# Patient Record
Sex: Female | Born: 1975 | Race: Black or African American | Hispanic: No | Marital: Single
Health system: Southern US, Academic
[De-identification: ages and names within clinical notes are randomized; demographics above are authoritative.]

## PROBLEM LIST (undated history)

## (undated) ENCOUNTER — Encounter

## (undated) ENCOUNTER — Encounter: Attending: Neurology | Primary: Neurology

## (undated) ENCOUNTER — Ambulatory Visit: Payer: PRIVATE HEALTH INSURANCE | Attending: Neurology | Primary: Neurology

## (undated) ENCOUNTER — Ambulatory Visit: Attending: Physical Medicine & Rehabilitation | Primary: Physical Medicine & Rehabilitation

## (undated) ENCOUNTER — Telehealth

## (undated) ENCOUNTER — Encounter
Attending: Pharmacist Clinician (PhC)/ Clinical Pharmacy Specialist | Primary: Pharmacist Clinician (PhC)/ Clinical Pharmacy Specialist

## (undated) ENCOUNTER — Telehealth: Attending: Neurology | Primary: Neurology

## (undated) ENCOUNTER — Ambulatory Visit

## (undated) ENCOUNTER — Ambulatory Visit: Payer: PRIVATE HEALTH INSURANCE

## (undated) ENCOUNTER — Ambulatory Visit: Payer: PRIVATE HEALTH INSURANCE | Attending: Physician Assistant | Primary: Physician Assistant

## (undated) ENCOUNTER — Encounter: Attending: Physician Assistant | Primary: Physician Assistant

## (undated) ENCOUNTER — Ambulatory Visit: Payer: PRIVATE HEALTH INSURANCE | Attending: Urology | Primary: Urology

## (undated) ENCOUNTER — Encounter: Attending: Family | Primary: Family

## (undated) ENCOUNTER — Ambulatory Visit
Attending: Pharmacist Clinician (PhC)/ Clinical Pharmacy Specialist | Primary: Pharmacist Clinician (PhC)/ Clinical Pharmacy Specialist

## (undated) DIAGNOSIS — G35 Multiple sclerosis: Secondary | ICD-10-CM

## (undated) HISTORY — PX: ABDOMINAL HYSTERECTOMY: SHX81

---

## 2017-10-24 ENCOUNTER — Ambulatory Visit
Admit: 2017-10-24 | Discharge: 2017-10-24 | Disposition: A | Discharge status: Home / Self Care | Payer: PRIVATE HEALTH INSURANCE | Attending: Dermatology

## 2017-10-24 MED ORDER — CLINDAMYCIN 2 % VAGINAL CREAM
Freq: Every evening | VAGINAL | 0 refills | 0.00000 days | Status: CP
Start: 2017-10-24 — End: 2017-10-31

## 2017-10-24 MED ORDER — FLUCONAZOLE 150 MG TABLET
ORAL_TABLET | 0 refills | 0 days | Status: CP
Start: 2017-10-24 — End: 2018-01-31

## 2018-01-31 ENCOUNTER — Ambulatory Visit
Admit: 2018-01-31 | Discharge: 2018-01-31 | Disposition: A | Discharge status: Home / Self Care | Payer: PRIVATE HEALTH INSURANCE

## 2018-01-31 MED ORDER — TIZANIDINE 2 MG TABLET
ORAL_TABLET | Freq: Four times a day (QID) | ORAL | 0 refills | 0 days | Status: CP | PRN
Start: 2018-01-31 — End: 2018-05-03

## 2018-02-14 ENCOUNTER — Ambulatory Visit: Admit: 2018-02-14 | Discharge: 2018-02-15 | Payer: PRIVATE HEALTH INSURANCE

## 2018-02-14 DIAGNOSIS — G35 Multiple sclerosis: Principal | ICD-10-CM

## 2018-02-21 ENCOUNTER — Ambulatory Visit
Admit: 2018-02-21 | Discharge: 2018-02-22 | Disposition: A | Discharge status: Home / Self Care | Payer: PRIVATE HEALTH INSURANCE

## 2018-02-21 MED ORDER — MELOXICAM 15 MG TABLET
ORAL_TABLET | Freq: Every day | ORAL | 0 refills | 0.00000 days | Status: CP
Start: 2018-02-21 — End: 2018-03-15

## 2018-02-22 MED ORDER — CLINDAMYCIN 2 % VAGINAL CREAM
Freq: Every evening | VAGINAL | 0 refills | 0.00000 days | Status: CP
Start: 2018-02-22 — End: 2018-03-01

## 2018-03-08 ENCOUNTER — Ambulatory Visit: Admit: 2018-03-08 | Discharge: 2018-03-09 | Payer: PRIVATE HEALTH INSURANCE | Attending: Family | Primary: Family

## 2018-03-08 DIAGNOSIS — M62838 Other muscle spasm: Principal | ICD-10-CM

## 2018-03-08 MED ORDER — DICLOFENAC SODIUM 50 MG TABLET,DELAYED RELEASE
ORAL_TABLET | Freq: Two times a day (BID) | ORAL | 0 refills | 0 days | Status: CP
Start: 2018-03-08 — End: ?

## 2018-03-15 ENCOUNTER — Ambulatory Visit: Admit: 2018-03-15 | Discharge: 2018-03-16 | Payer: PRIVATE HEALTH INSURANCE | Attending: Neurology | Primary: Neurology

## 2018-03-15 DIAGNOSIS — Z23 Encounter for immunization: Secondary | ICD-10-CM

## 2018-03-15 DIAGNOSIS — F419 Anxiety disorder, unspecified: Secondary | ICD-10-CM

## 2018-03-15 DIAGNOSIS — G379 Demyelinating disease of central nervous system, unspecified: Principal | ICD-10-CM

## 2018-03-15 DIAGNOSIS — G959 Disease of spinal cord, unspecified: Secondary | ICD-10-CM

## 2018-03-15 MED ORDER — DIAZEPAM 5 MG TABLET
ORAL_TABLET | Freq: Once | ORAL | 0 refills | 0 days | Status: CP | PRN
Start: 2018-03-15 — End: 2018-05-03

## 2018-04-08 ENCOUNTER — Ambulatory Visit: Admit: 2018-04-08 | Discharge: 2018-04-09 | Payer: PRIVATE HEALTH INSURANCE

## 2018-04-08 DIAGNOSIS — G379 Demyelinating disease of central nervous system, unspecified: Principal | ICD-10-CM

## 2018-05-03 ENCOUNTER — Ambulatory Visit: Admit: 2018-05-03 | Discharge: 2018-05-04 | Payer: PRIVATE HEALTH INSURANCE | Attending: Neurology | Primary: Neurology

## 2018-05-03 DIAGNOSIS — E559 Vitamin D deficiency, unspecified: Secondary | ICD-10-CM

## 2018-05-03 DIAGNOSIS — G379 Demyelinating disease of central nervous system, unspecified: Principal | ICD-10-CM

## 2018-05-03 MED ORDER — CHOLECALCIFEROL (VITAMIN D3) 100 MCG (4,000 UNIT) TABLET
ORAL_TABLET | Freq: Every day | ORAL | 11 refills | 0.00000 days | Status: CP
Start: 2018-05-03 — End: ?

## 2018-05-04 MED ORDER — GLATIRAMER 40 MG/ML SUBCUTANEOUS SYRINGE
SUBCUTANEOUS | 5 refills | 0 days | Status: CP
Start: 2018-05-04 — End: 2018-07-06
  Filled 2018-05-16: qty 12, 28d supply, fill #0

## 2018-05-04 NOTE — Unmapped (Signed)
Per test claim for GLATIRAMER 40 MG/ML SYRINGE at the [facility], patient needs Medication Assistance Program for Prior Authorization.

## 2018-05-04 NOTE — Unmapped (Signed)
[facility] [place] at [facility]  Multiple Sclerosis/Neuroimmunology Division  Sarita Bottom, MD  Associate Professor of Neurology    DATE OF VISIT: 05/03/2018    Re:  Debbie Chen  [address]  [place]  [place] [address]  MRN: [id number]  DOB: 1976/01/10        Direct entry by: Dr. Sarita Bottom    Visit: Follow-up Visit      REASON FOR VISIT: Debbie Chen, a 42 y.o. African American right handed female, is seen in consultation at the [facility], Multiple Sclerosis/Neuroimmunology Division at the request of Lucienne Capers, MD for the evaluation of MS.   Ms. Seanna Sisler was last time seen at the [facility], Multiple Sclerosis/Neuroimmunology Division on 03/15/18.    Assessment:     1. I took a detailed interval history of the present illness fromMs. Georgia Chen , details on past medical history, family history and social history.  2. I personally reviewed  patient's interval medical records, MRI images and discussed them with the patient. .   3. The differential diagnosis and the plan for the diagnostic work-up were discussed in details with the patient. Debbie Chen agreed with the recommended diagnostic plan  4. Potential treatment options have been  discussed with Debbie Chen, who agreed with the recommended treatment plan.                                                                                                                                                  ?? Multiple sclerosis (previous diagnosis):  Debbie Chen, is a 42 y.o. African American right handed female with a previous diagnosis of MS. Her brain MRI still does not meet criteria for DIS in MS. She does have a T spinal cord lesion that may be a sequel of her transverse myelitis. We will perform the spinal tap to check for the dynamics of the CSF OCBs (that were per previous notes- positive in the past). Given possible MS, I would recommend to re-start Glatiramer acetate, given its favorable safety profile. Please see the detailed plan below.     Plan:     1) Re-start Glatiramer acetate, 40mg  three times a week s.c.  2) schedule LP for routine CSF  3) Start Vitamin D 4000 U/day for vitamin D insufficiency  3) Follow-up with me in 6 months, earlier if needed       Subjective:     HISTORY OF PRESENT ILLNESS:  Debbie Chen, is a 42 y.o. African American right handed female with a previous diagnosis of MS.   Per records Her first MS exacerbation was back on 2006 when she had numbness in the left arm and leg lasting for  2 weeks from breat down and diagnosed with TM in [place]. She received IV steroids at that time. She was seen by a neurologist in [place], Dr. Estrellita Ludwig. Lumbar puncture was positive for oligoclonal bands. She was diagnosed with hemi transverse myelitis, incomplete. MRI of the cervical spine showed a mildly expansile C5 cord lesion with underlying enhancement.  She had a second clinical event was after 5 years, with similar symptoms on the left side of the body not affecting face. She received IV steroids and recovered.  Per notes, her next  attack was in  05/22/2016. She remembers she had left sided numbness including the face. She did not receive IV steroids at that time, but got the steroid oral pack. Symptoms recovered.  After that last attack she did not have any new flares.     Current symptoms: stiff neck, muscle tightness, leg numbness, leg pain, fatigue, bladder control problems (leakage, incomplete emptying, hesitancy). She has seen urologist one time long time ago. Sometimes vision gets blurry 1-2 h on the left eye. She has frontal headaches, not frequent, sometimes wakes up with a headache, takes Aleve PRN (has headaches 3-4x week).  Takes Diclofenac for neck pain, Takes VitamiN D 5000 U/day.    MS DMD history:    Copaxone was started in 2011 and stopped in January 2018. Since then she is off medications ('maybe because she was not seeing the doctor).     Had hysterectomy due to fibroids and endometriosis.     Never used birth control. She is not sure about having or not miscarriages. Has hip pain, dry eyes. Denies  mouth sores, no genital sores. Since the last visit with me she did not have new symptoms.     Please see below the results of the diagnostic work-up performed so far.   ............................................................................................................................................Debbie Chen  DIAGNOSTIC STUDIES / REVIEW OF RECORDS:    MRI:  06/20/2016: Brain MRI with and w/o contrast, report (COMPARISON: 04/06/2015): The ventricles and sulci are symmetric in appearance and within normal limits for age.  Images are mildly degraded by motion and axial FLAIR sequences were repeated.I am not convinced of any definite focal lesions. Questionable small areas described previously, including a tiny focus adjacent to the right frontal horn and minimal linear signal along the undersurface of the posterior callosum may be physiologic. No definite stigmata of demyelinating disease. No new findings. No extra-axial fluid collection. No acute infarction. No pathologic enhancement. The pituitary, pineal region, and foramen magnum are unremarkable, and flow voids are present in the major intracranial vessels and dural sinuses.The orbits and paranasal sinuses are unremarkable.  06/20/2016: Cervical MRI with and w/o contrast, report (COMPARISON: April 06, 2015): Focal cord lesion with enhancement at the C6 level could represent demyelinating disease and has developed since the previous study. Further follow-up may be helpful for confirmation if clinically appropriate.  06/20/2016: Thoracic MRI with and w/o contrast, report (COMPARISON: April 06, 2015): Unremarkable thoracic spine MRI.  04/08/2018: Brain MRI with and w/o contrast, report (COMPARISON: MRI brain 06/20/2016): Multiple small supratentorial foci of white matter T2/FLAIR hyperintensity within appearance and distribution which may be compatible with multiple sclerosis. Several lesions appear new from prior exam 06/20/2016. No enhancing lesions. The optic nerves are unremarkable.  04/08/2018: Cervical MRI with and w/o contrast, report ( COMPARISON: MRI brain 06/20/2016): - Short segment T2 hyperintense spinal cord lesion at the T6 level. No abnormal cord enhancement.   - Overall mild degenerative change of the cervical spine with moderate-to-severe  right C3-C4 neural foraminal narrowing.  04/08/2018: Thoracic MRI with and w/o contrast, report: Small T2 hyperintense lesion within the left paramedian spinal cord at the T6-T7 level. No abnormal cord enhancement.     Lumbar puncture:  Positive CSF OCBs per notes, but results will be requested.     Blood tests:   11/18/2015: RPR and HIV negative.    Office Visit on 03/15/2018   Component Date Value Ref Range Status   ??? Sodium 03/15/2018 138  135 - 145 mmol/L Final   ??? Potassium 03/15/2018 4.2  3.5 - 5.0 mmol/L Final   ??? Chloride 03/15/2018 100  98 - 107 mmol/L Final   ??? CO2 03/15/2018 27.0  22.0 - 30.0 mmol/L Final   ??? BUN 03/15/2018 12  7 - 21 mg/dL Final   ??? Creatinine 03/15/2018 0.80  0.60 - 1.00 mg/dL Final   ??? BUN/Creatinine Ratio 03/15/2018 15   Final   ??? EGFR CKD-EPI Non-African American,* 03/15/2018 >90  >=60 mL/min/1.89m2 Final   ??? EGFR CKD-EPI African American, Fem* 03/15/2018 >90  >=60 mL/min/1.18m2 Final   ??? Glucose 03/15/2018 87  65 - 179 mg/dL Final   ??? Calcium 16/03/9603 9.7  8.5 - 10.2 mg/dL Final   ??? Albumin 54/02/8118 4.1  3.5 - 5.0 g/dL Final   ??? Total Protein 03/15/2018 8.1  6.5 - 8.3 g/dL Final   ??? Total Bilirubin 03/15/2018 0.3  0.0 - 1.2 mg/dL Final   ??? AST 14/78/2956 21  14 - 38 U/L Final   ??? ALT 03/15/2018 <8* 15 - 48 U/L Final   ??? Alkaline Phosphatase 03/15/2018 68  38 - 126 U/L Final   ??? Anion Gap 03/15/2018 11  9 - 15 mmol/L Final   ??? Antinuclear Antibodies (ANA) 03/15/2018 Positive* Negative Final   ??? ANA Pattern 1 03/15/2018 Speckled   Final   ??? ANA Titer 1 03/15/2018 1:80   Final   ??? ENA Screen 03/15/2018 0.20  <0.70 ENA Units Final   ??? ANCA IFA 03/15/2018 Positive, Perinuclear Pattern* Negative Final   ??? MPO-Elisa 03/15/2018 Negative  Negative Final   ??? MPO-Quant 03/15/2018 1.5  <21.0 U/mL Final   ??? PR3 Elisa 03/15/2018 Negative  Negative Final   ??? PR3-Quant 03/15/2018 12.0  <21.0 U/mL Final   ??? Rheumatoid Factor 03/15/2018 <8.6  0.0 - 15.0 IU/mL Final   ??? Angio Convert Enzyme 03/15/2018 59  16 - 85 U/L Final   ??? Lyme Ab (Serology) 03/15/2018 Negative  Negative Final   ??? NMO AQP4 IgG, Serum 03/15/2018 Negative  Negative Final   ??? MOG FACS 03/15/2018 Negative  Negative Final   ??? Varicella IgG 03/15/2018 Positive   Final   ??? HSV 1 IgG 03/15/2018 Positive* Negative Final   ??? HSV 2 IgG 03/15/2018 Positive* Negative Final   ??? HSV Igm Screen 03/15/2018 Negative  Negative Final   ??? Vitamin D Total (25OH) 03/15/2018 27.6  20.0 - 80.0 ng/mL Final   ??? Vitamin B-12 03/15/2018 914* 193 - 900 pg/ml Final   ??? Vit D, 1,25-Dihydroxy 03/15/2018 38  18 - 78 pg/mL Final   ??? WBC 03/15/2018 6.2  4.5 - 11.0 10*9/L Final   ??? RBC 03/15/2018 4.93  4.00 - 5.20 10*12/L Final   ??? HGB 03/15/2018 13.4  12.0 - 16.0 g/dL Final   ??? HCT 21/30/8657 43.8  36.0 - 46.0 % Final   ??? MCV 03/15/2018 89.0  80.0 - 100.0 fL Final   ??? MCH 03/15/2018 27.1  26.0 - 34.0 pg Final   ??? MCHC 03/15/2018 30.5* 31.0 - 37.0 g/dL Final   ??? RDW 16/03/9603 13.6  12.0 - 15.0 % Final   ??? MPV 03/15/2018 10.9* 7.0 - 10.0 fL Final   ??? Platelet 03/15/2018 316  150 - 440 10*9/L Final   ??? Neutrophils % 03/15/2018 61.8  % Final   ??? Lymphocytes % 03/15/2018 28.4  % Final   ??? Monocytes % 03/15/2018 5.6  % Final   ??? Eosinophils % 03/15/2018 1.1  % Final   ??? Basophils % 03/15/2018 0.6  % Final   ??? Absolute Neutrophils 03/15/2018 3.9  2.0 - 7.5 10*9/L Final ??? Absolute Lymphocytes 03/15/2018 1.8  1.5 - 5.0 10*9/L Final   ??? Absolute Monocytes 03/15/2018 0.4  0.2 - 0.8 10*9/L Final   ??? Absolute Eosinophils 03/15/2018 0.1  0.0 - 0.4 10*9/L Final   ??? Absolute Basophils 03/15/2018 0.0  0.0 - 0.1 10*9/L Final   ??? Large Unstained Cells 03/15/2018 3  0 - 4 % Final   ??? Hypochromasia 03/15/2018 Moderate* Not Present Final   ??? Dilute Viper Venom Time 03/15/2018 31.9  0.0 - 47.5 s Final   ??? APTT Lupus Anticoagulant 03/15/2018 36.6  0.0 - 48.2 s Final   ??? Anti-PL Interp 03/15/2018    Final   ??? Beta-2 Glyco 1 IgG 03/15/2018 5  <20 Final   ??? Beta-2 Glyco 1 IgM 03/15/2018 7  <20 Final   ??? Anticardiolipin IgG 03/15/2018 11  0 - 23 [GPL'U] Final   ??? Anticardiolipin IgM 03/15/2018 4  0 - 11 [MPL'U] Final       .............................................................................................................................................    Past Medical History:  Past Medical History:   Diagnosis Date   ??? Multiple sclerosis (CMS-HCC)        ALLERGIES:  No Known Allergies    CURRENT MEDICATIONS:    Current Outpatient Medications   Medication Sig Dispense Refill   ??? diazePAM (VALIUM) 5 MG tablet Take 1 tablet (5 mg total) by mouth once as needed (Take 1 pill 30 min prior to MRI, may take one more pill if needed.). for up to 1 dose 2 tablet 0   ??? diclofenac (VOLTAREN) 50 MG EC tablet Take 1 tablet (50 mg total) by mouth Two (2) times a day. 30 tablet 0   ??? tiZANidine (ZANAFLEX) 2 MG tablet Take 1 tablet (2 mg total) by mouth every six (6) hours as needed. For muscle spasm 20 tablet 0     No current facility-administered medications for this visit.            Past Surgical History:    Past Surgical History:   Procedure Laterality Date   ??? HYSTERECTOMY         Social History:    Social History     Socioeconomic History   ??? Marital status: Single     Spouse name: None   ??? Number of children: None   ??? Years of education: None   ??? Highest education level: None   Occupational History ??? None   Social Needs   ??? Financial resource strain: None   ??? Food insecurity:     Worry: None     Inability: None   ??? Transportation needs:     Medical: None     Non-medical: None   Tobacco Use   ??? Smoking status: Never Smoker   ??? Smokeless tobacco: Never Used   Substance and Sexual Activity   ???  Alcohol use: Yes     Comment: 2 glasses of wine per week.    ??? Drug use: No   ??? Sexual activity: Yes     Comment: Not using birth control   Lifestyle   ??? Physical activity:     Days per week: None     Minutes per session: None   ??? Stress: None   Relationships   ??? Social connections:     Talks on phone: None     Gets together: None     Attends religious service: None     Active member of club or organization: None     Attends meetings of clubs or organizations: None     Relationship status: None   Other Topics Concern   ??? None   Social History Narrative   ??? None       Family History:    Family History   Problem Relation Age of Onset   ??? Hypertension Mother    ??? Hyperlipidemia Father    ??? Hypertension Father         Review of Systems:  A 10-systems review was performed and, unless otherwise noted, declared negative by patient.    Objective:     Physical Exam:  Blood pressure 133/70, pulse 84, resp. rate 19, height 170.2 cm (5' 7), weight (!) 105.8 kg (233 lb 3.2 oz), last menstrual period 09/06/2013, not currently breastfeeding.   General Appearance: in no acute distress. Normal skin color, afebrile.  Heart and lungs: Regular heart rate. Eupneic, normal respiratory rate.. Abdomen: Soft, non-tender.No peripheral  edema, peripheral pulses palpable.       NEUROLOGICAL EXAMINATION:     General:  Alert and oriented to person, place, time and situation.    Recent and remote memory intact.    Attention span and concentration normal.    Language and spontaneous speech normal, no dysarthria or aphasia.  Naming/fluency/repetition intact.  Fund of knowledge normal.  Following lateralizing commands across midline.       Cranial Nerves:     II, III- Pupils are equal and reactive to light b/l (direct and consensual reactions). Visual Acuity: 20/20 b/l.  Glasses:yes. No visual field defect. Fundoscopy: clear optic disc margins, no optic disc pallor.   III, IV, VI- extra ocular movements are intact, No ptosis, no diplopia, no nystagmus.  V- sensation of the face intact b/l.   VII- face symmetrical, no facial droop, normal facial movements with smile/grimace  VIII- Hearing grossly intact. Weber test: sound is symmetrical with no lateralization.  IX and X- symmetric palate contraction,  no dysarthria, no dysphagia.  XI- Full shoulder shrug bilaterally; no wasting, normal tone and strength of sternocleidomastoid muscles bilaterally.  XII- No tongue atrophy, no tongue fasciculations; tongue protrudes midline, full range of movements of the tongue.    Neck flexion normal.    Motor Exam:     Normal bulk. Fasciculations not observed.     Muscle strength:    Muscles UEs  LEs    R L  R L   Deltoids 5/5 5/5 Hip flexors  5/5 5/5   Biceps 5/5 5/5 Hip extensors 5/5 5/5   Triceps 5/5 5/5 Knee flexors 5/5 5/5   Hand grip 5/5 5/5 Knee extensors 5/5 5/5   Wrist flexors 5/5 5/5 Foot dorsal flexors 5/5 5/5   Wrist extensors 5/5 5/5 Foot plantar flexors 5/5 5/5   Finger flexors 5/5 5/5 Toes dorsal flexors 5/5 5/5   Finger extensors 5/5 5/5  Toes plantar flexors 5/5 5/5      McArdle's sign negative.       Reflexes R L   Biceps +2 +2   Brachioradialis  +2 +2   Triceps +2 +2   Patella +3 +3   Achilles +2 +2     Normal tone b/l. Negative Babinski sign bilaterally.    Sensory system:  ? Superficial light touch sensation:wnl  ? Vibration sense:wnl  ? Position sense:wnl  ? Pinprick test for pain sensation:wnl  (WNL= within normal limits; UE= upper extremities; LE= lower extremities;       R= right, L= left).    Cerebellar/Coordination:  Rapid alternating movements, finger-to-nose and heel-to-shin  bilaterally demonstrate no abnormalities. No limb ataxia bilaterally. No gait ataxia. Romberg negative. Performs a tandem gait.    Gait: Normal stride, base and  armswing. Able to walk on toes, heels without difficulty.     Tests for meningeal irritation: negative.    .........................................................................................................................................Debbie Chen    VISIT SUMMARY:  Ms. Cariana Karge, a 42 y.o. female  presented for the evaluation of MS.   Ms. Freada Twersky voiced a complete understanding of the diagnostic and treatment plan as detailed above. All questions were answered.   Start of Visit Time: 16:50h  End of Visit Time: 17:24h  Total visit time = 34 minutes    Greater than 50% of the face to face time was spent in consultation on the  disease process, and treatment planning, medication, dosing and side effects.     Thank you for the opportunity to contribute to the care of Debbie Chen.

## 2018-05-05 NOTE — Unmapped (Signed)
[facility] Medication Referral: PA APPROVED    Medication (Brand/Generic): GLATIRAMER    Initial Test Claim completed with resulted information below:  No PA required  Patient ABLE to fill at [facility]  Insurance Company:  [employer]  Anticipated Copay: $11.11  Is anticipated copay with a copay card or grant? NO    Does patient's insurance plan only allow a 15 day supply for the first 6 fills in the [facility]? NO  If yes, inform patient they can request to dis-enroll from the [facility] by calling the patient help desk at N/A.      As Co-pay is under $25 defined limit, per policy there will be no further investigation of need for financial assistance at this time unless patient requests. This referral has been communicated to the provider and handed off to the [facility] team for further processing and filling of prescribed medication.   ______________________________________________________________________  Please utilize this referral for viewing purposes as it will serve as the central location for all relevant documentation and updates.

## 2018-05-11 MED ORDER — EMPTY CONTAINER
2 refills | 0 days
Start: 2018-05-11 — End: ?

## 2018-05-11 NOTE — Unmapped (Signed)
[facility] Neurology  Patient Onboarding/Medication Counseling    Debbie Chen is a 42 y.o. female with MS who I am counseling today on initiation of therapy.    Medication: glatiramer acetate 40 mg    Verified patient's date of birth / HIPAA.  Medications reviewed and verified with patient: Allergies - Medications -    .      Education Provided: ?    Dose/Administration discussed: Inject subcutaneously three times per week, Monday, Wednesday, and Friday. This medication should be taken  without regard to food. Stressed the importance of taking medication as prescribed and to contact provider if that changes at any time. If dose is missed or unsure if it has been missed, instructed patient to call clinic pharmacist. Patient has been on medication in the past and states she is familiar with it and with administration.     Storage requirements: this medicine should be stored in the refrigerator.     Side effects discussed: Discussed common side effects, including injection site reaction, headache, palpitations. If patient experiences this and is bothersome, they need to call the doctor.  Patient will receive a Lexi-Comp drug information handout with shipment.    Handling precautions reviewed:  Patient will dispose of needles in a sharps container or empty laundry detergent bottle.    Drug Interactions: other medications reviewed and up to date in Epic.  No drug interactions identified.    Comorbidities/Allergies: reviewed and up to date in Epic.    Verified therapy is appropriate and should continue      Delivery Information    Medication Assistance provided: Prior Authorization    Anticipated copay of $11.11 reviewed with patient. Verified delivery address in [facility] and reviewed medication storage requirement.    Scheduled delivery date: 05/17/2018    Explained that we ship using UPS and this shipment will not require a signature.      Explained the services we provide at [facility] and that each month we would call to set up refills.  Stressed importance of returning phone calls so that we could ensure they receive their medications in time each month.  Informed patient that we should be setting up refills 7-10 days prior to when they will run out of medication.  Informed patient that welcome packet will be sent.      Patient verbalized understanding of the above information as well as how to contact the pharmacy at [phone] option 4 with any questions/concerns. The pharmacy is open Monday through Friday 8:30am-4:30pm. A pharmacist is available 24/7 via pager to answer any clinical questions they may have.        Patient Specific Needs      ? Patient has no physical, cognitive, or cultural barriers.    ? Patient prefers to have medications discussed with  Patient     ? Patient is able to read and understand education materials at a high school level or above.    ? Patients primary language is  English       Worthy Flank, PharmD, CPP  Clinical Pharmacist, [facility]  Phone: [phone]

## 2018-05-16 MED FILL — EMPTY CONTAINER: 30 days supply | Qty: 1 | Fill #0 | Status: AC

## 2018-05-16 MED FILL — GLATIRAMER 40 MG/ML SUBCUTANEOUS SYRINGE: 28 days supply | Qty: 12 | Fill #0 | Status: AC

## 2018-05-16 MED FILL — EMPTY CONTAINER: 30 days supply | Qty: 1 | Fill #0

## 2018-05-21 ENCOUNTER — Ambulatory Visit: Admit: 2018-05-21 | Discharge: 2018-05-22 | Payer: PRIVATE HEALTH INSURANCE

## 2018-05-21 DIAGNOSIS — B9689 Other specified bacterial agents as the cause of diseases classified elsewhere: Secondary | ICD-10-CM

## 2018-05-21 DIAGNOSIS — M25552 Pain in left hip: Secondary | ICD-10-CM

## 2018-05-21 DIAGNOSIS — G35 Multiple sclerosis: Secondary | ICD-10-CM

## 2018-05-21 DIAGNOSIS — N898 Other specified noninflammatory disorders of vagina: Principal | ICD-10-CM

## 2018-05-21 DIAGNOSIS — N760 Acute vaginitis: Secondary | ICD-10-CM

## 2018-05-21 MED ORDER — NAPROXEN 500 MG TABLET
ORAL_TABLET | Freq: Two times a day (BID) | ORAL | 0 refills | 0.00000 days | Status: CP
Start: 2018-05-21 — End: 2019-05-21

## 2018-05-21 MED ORDER — TIZANIDINE 4 MG TABLET
ORAL_TABLET | Freq: Every evening | ORAL | 1 refills | 0.00000 days | Status: CP | PRN
Start: 2018-05-21 — End: ?

## 2018-05-21 MED ORDER — METRONIDAZOLE 500 MG TABLET
ORAL_TABLET | Freq: Two times a day (BID) | ORAL | 0 refills | 0 days | Status: CP
Start: 2018-05-21 — End: 2018-05-21

## 2018-05-21 MED ORDER — CLINDAMYCIN 100 MG VAGINAL SUPPOSITORY
Freq: Every evening | VAGINAL | 0 refills | 0.00000 days | Status: CP
Start: 2018-05-21 — End: 2018-05-24

## 2018-05-21 NOTE — Unmapped (Signed)
Assessment/Plan:        Final diagnoses:   Vaginal discharge   Left hip pain (Primary)   Multiple sclerosis (CMS-HCC)   Bacterial vaginosis     Left hip x-ray showed only mild degenerative changes of the hip joint.  Patient is to take naproxen twice a day with food.  She was prescribed clindamycin vaginal cream to use nightly for 7 days (she says oral metronidazole causes nausea).  She has used metronidazole gel in the past and thought we should switch to clindamycin.  Her tizanidine 4mg  was also refilled #30 x 1 for her to use for her back discomfort.        Subjective:       Patient ID: Debbie Chen is a 42 y.o. female.    MS. Sherrin has multiple sclerosis.  She has had episodic pain in her left hip for 2 weeks.  The pain occurs mostly after she has been sitting for a few minutes and it is also worse when she first tries to get out of bed.  Pain does not radiate down her left leg.  Patient has chronic low back pain which often interferes with sleep.  She has taken tizanidine for her low back pain in the past but she is out of that medication.  She also has a vaginal discharge that is similar to her prior BV infections.    Groin Pain   The patient's primary symptoms include vaginal discharge. The problem occurs daily. The problem has been unchanged. The pain is moderate. The problem affects the left side. She is not pregnant. Associated symptoms include back pain (Chronic). Pertinent negatives include no abdominal pain, dysuria, flank pain or frequency. There has been no bleeding. She has tried nothing for the symptoms.   Vaginal Discharge   The patient's primary symptoms include vaginal discharge. Associated symptoms include back pain (Chronic). Pertinent negatives include no abdominal pain, dysuria, flank pain or frequency.         Review of Systems   Gastrointestinal: Negative for abdominal pain.   Genitourinary: Positive for vaginal discharge. Negative for dysuria, flank pain and frequency. Musculoskeletal: Positive for back pain (Chronic).           Objective:    Physical Exam   Constitutional:   Overweight BF in NAD.   Cardiovascular: Intact distal pulses.   Pulmonary/Chest: Effort normal. No respiratory distress.   Abdominal: Soft. Bowel sounds are normal. She exhibits no distension. There is no tenderness.   Musculoskeletal:      Comments: Normal ROM of left hip.  Patient has no lower extremity edema.   Nursing note and vitals reviewed.    Vitals:    05/21/18 0923   BP: 118/78   Pulse: 79   Resp: 12   Temp: 36.8 ??C (98.3 ??F)   SpO2: 100%     Xr Hip W Pelvis Left    Result Date: 05/21/2018  EXAM: XR HIP W PELVIS LEFT DATE: 05/21/2018 10:01 AM ACCESSION: [id number] UN DICTATED: 05/21/2018 10:07 AM INTERPRETATION LOCATION: [facility]     CLINICAL INDICATION: 42 years old Female with left hip pain for 2 weeks  - M25.552 - Left hip pain      COMPARISON: None.     TECHNIQUE: AP views of the pelvis and left hip and frog leg lateral view of the left hip.     FINDINGS: No fracture or dislocation. Mild bilateral hip joint space narrowing inferiorly. No lytic or sclerotic osseous lesions. Normal soft  tissues. The visualized pelvis is unremarkable.         Mild bilateral hip joint space narrowing, compatible with early osteoarthrosis.

## 2018-05-21 NOTE — Unmapped (Addendum)
Take the naproxen twice a day with food for your left hip pain.  The pain is likely some joint inflammation from early arthritis.  If the pain goes away, you can stop naproxen.  You do have BV again and I have prescribed clindamycin vaginal cream.  I have also refilled tizanidine for you to take at night as needed for back pain.

## 2018-06-02 NOTE — Unmapped (Signed)
[facility] Refill Coordination Note    Specialty Medication(s) to be Shipped:   Neurology: Copaxone    Other medication(s) to be shipped:      Debbie Chen, DOB: [birth year]  Debbie Chen  Phone: [phone] (home)   Shipping Address: [address]  [address]  [place] [address]           All above HIPAA information was verified with patient.     Completed refill call assessment today to schedule patient's medication shipment from the [facility] [phone]).       Specialty medication(s) and dose(s) confirmed: Regimen is correct and unchanged.   Changes to medications: [name] reports no changes reported at this time.  Changes to insurance: No  Questions for the pharmacist: No    The patient will receive a drug information handout for each medication shipped and additional FDA Medication Guides as required.      DISEASE/MEDICATION-SPECIFIC INFORMATION        N/A        MEDICARE PART B DOCUMENTATION     glatiramer 40 mg/mL Syrg: Patient has 4 on hand.        ADHERENCE     Medication Adherence    Patient reported X missed doses in the last month:  0  Specialty Medication:  GLATIRAMER (#OH- 4 )   Patient is on additional specialty medications:  No  Informant:  patient          Support network for adherence:  family member      Confirmed plan for next specialty medication refill:  delivery by pharmacy  Refills needed for supportive medications:  not needed          Refill Coordination    Has the Patients' Contact Information Changed:  No  Is the Shipping Address Different:  No         Drug Interactions    Clinically relevant drug interactions identified:  no                     ADDITIONAL NOTES         N/A        SHIPPING     Shipping Address: [address]  [address]  [place] [address]    Shipping address confirmed in Epic.     Delivery Scheduled: Yes, Expected medication delivery date: 06/07/18 via UPS or courier.     Medication will be delivered via Same Day Courier to the home address in Epic WAM.    Jolene Schimke , CPhT   [facility] Specialty Technician

## 2018-06-07 MED FILL — GLATIRAMER 40 MG/ML SUBCUTANEOUS SYRINGE: SUBCUTANEOUS | 28 days supply | Qty: 12 | Fill #1

## 2018-06-07 MED FILL — GLATIRAMER 40 MG/ML SUBCUTANEOUS SYRINGE: 28 days supply | Qty: 12 | Fill #1 | Status: AC

## 2018-07-04 NOTE — Unmapped (Signed)
[facility] Refill Coordination Note    Specialty Medication(s) to be Shipped:   Neurology: Ancil Linsey 40mg /ml    Other medication(s) to be shipped: none     Debbie Chen, DOB: February 12, 1976  Phone: [phone] (home)       All above HIPAA information was verified with patient.     Completed refill call assessment today to schedule patient's medication shipment from the [facility] [phone]).       Specialty medication(s) and dose(s) confirmed: Regimen is correct and unchanged.   Changes to medications: Kathey reports no changes reported at this time.  Changes to insurance: No  Questions for the pharmacist: No    The patient will receive a drug information handout for each medication shipped and additional FDA Medication Guides as required.      DISEASE/MEDICATION-SPECIFIC INFORMATION        N/A    ADHERENCE     Medication Adherence            Support network for adherence:  family member                  MEDICARE PART B DOCUMENTATION     GLATRAMER 40mg /ml: Patient has 1 dose of on hand.    SHIPPING     Shipping address confirmed in Epic.     Delivery Scheduled: Yes, Expected medication delivery date: 07/06/18 via UPS or courier.     Medication will be delivered via UPS to the home address in Epic WAM.    [place] A Wyley Hack   [facility] Specialty Technician

## 2018-07-05 NOTE — Unmapped (Signed)
Debbie Chen 's Glatramer shipment will be delayed due to Can not fill at [facility] We have contacted the patient and Routed to specific pharmacy We will call the patient to reschedule the delivery upon resolution. We have confirmed the delivery date as N/A .

## 2018-07-06 MED ORDER — GLATIRAMER 40 MG/ML SUBCUTANEOUS SYRINGE
SUBCUTANEOUS | 4 refills | 0.00000 days | Status: CP
Start: 2018-07-06 — End: 2018-07-07

## 2018-07-06 NOTE — Unmapped (Signed)
Patient no longer able to fill glatiramer at [facility] due to insurance restrictions.     Resending to [facility].    Worthy Flank, PharmD, CPP  Clinical Pharmacist, [facility]  Phone: [phone]

## 2018-07-07 NOTE — Unmapped (Signed)
Patient requested a 90 day supply for GA.    Rx sent.    Amyla Heffner DB.

## 2018-07-08 MED ORDER — GLATIRAMER 40 MG/ML SUBCUTANEOUS SYRINGE
SUBCUTANEOUS | 1 refills | 0 days | Status: CP
Start: 2018-07-08 — End: 2019-02-01

## 2018-07-09 NOTE — Unmapped (Signed)
8:30, you had an opening.

## 2018-07-09 NOTE — Unmapped (Signed)
Patient stated for the last two days she has been experiencing numbness on her right side from the thigh up to her the right side of her face. She stated the numbness has been constant. She has become become extra sensitive to heat or cold. She is experiencing weakness in her right hand also. She stated she has problems unscrewing a top from a bottle with her right hand. Patient denied any any infections or fever. Patient also denied any new stressors in her life. Patient denied any extreme exposure to heat. The numbness has been constant for the last two days. Patient denied any pain. Information forwarded as requested.

## 2018-07-11 NOTE — Unmapped (Signed)
Dr. Johnnye Lana:    I have scheduled Ms. Debbie Chen, MR# [id number], a lumbar puncture on 09/02/2018, 9:00 AM (Arrival time 8:30 AM).  I left a voicemail message for [name] and also sent her a MyChart email message with the lumbar puncture appointment and instructions.

## 2018-07-27 NOTE — Unmapped (Signed)
Disenrolling patient as insurance requires she fill at [facility].

## 2019-02-01 MED ORDER — GLATIRAMER 40 MG/ML SUBCUTANEOUS SYRINGE
SUBCUTANEOUS | 1 refills | 84 days | Status: CP
Start: 2019-02-01 — End: ?

## 2019-03-05 ENCOUNTER — Emergency Department: Payer: 59

## 2019-03-05 ENCOUNTER — Emergency Department
Admission: EM | Admit: 2019-03-05 | Discharge: 2019-03-05 | Disposition: A | Discharge status: Home / Self Care | Payer: 59 | Attending: Emergency Medicine | Admitting: Emergency Medicine

## 2019-03-05 ENCOUNTER — Other Ambulatory Visit: Payer: Self-pay

## 2019-03-05 ENCOUNTER — Encounter: Payer: Self-pay | Admitting: Emergency Medicine

## 2019-03-05 DIAGNOSIS — E876 Hypokalemia: Secondary | ICD-10-CM | POA: Insufficient documentation

## 2019-03-05 DIAGNOSIS — R0789 Other chest pain: Secondary | ICD-10-CM | POA: Insufficient documentation

## 2019-03-05 DIAGNOSIS — R079 Chest pain, unspecified: Secondary | ICD-10-CM

## 2019-03-05 HISTORY — DX: Multiple sclerosis: G35

## 2019-03-05 LAB — CBC
HCT: 40 % (ref 36.0–46.0)
Hemoglobin: 12.6 g/dL (ref 12.0–15.0)
MCH: 26.6 pg (ref 26.0–34.0)
MCHC: 31.5 g/dL (ref 30.0–36.0)
MCV: 84.6 fL (ref 80.0–100.0)
Platelets: 272 10*3/uL (ref 150–400)
RBC: 4.73 MIL/uL (ref 3.87–5.11)
RDW: 14.2 % (ref 11.5–15.5)
WBC: 11 10*3/uL — ABNORMAL HIGH (ref 4.0–10.5)
nRBC: 0 % (ref 0.0–0.2)

## 2019-03-05 LAB — BASIC METABOLIC PANEL
Anion gap: 9 (ref 5–15)
BUN: 21 mg/dL — ABNORMAL HIGH (ref 6–20)
CO2: 26 mmol/L (ref 22–32)
Calcium: 8.5 mg/dL — ABNORMAL LOW (ref 8.9–10.3)
Chloride: 102 mmol/L (ref 98–111)
Creatinine, Ser: 0.99 mg/dL (ref 0.44–1.00)
GFR calc Af Amer: >60 mL/min (ref >60)
GFR calc non Af Amer: >60 mL/min (ref >60)
Glucose, Bld: 84 mg/dL (ref 70–99)
Potassium: 2.8 mmol/L — ABNORMAL LOW (ref 3.5–5.1)
Sodium: 137 mmol/L (ref 135–145)

## 2019-03-05 LAB — MAGNESIUM: Magnesium: 2.1 mg/dL (ref 1.7–2.4)

## 2019-03-05 LAB — TROPONIN I (HIGH SENSITIVITY)
Troponin I (High Sensitivity): 6 ng/L (ref <18)
Troponin I (High Sensitivity): 5 ng/L (ref <18)

## 2019-03-05 MED ORDER — POTASSIUM CHLORIDE ER 10 MEQ PO TBCR: 20.0000 meq | EXTENDED_RELEASE_TABLET | Freq: Every day | ORAL | 0 refills | Status: AC

## 2019-03-05 MED ORDER — FENTANYL CITRATE (PF) 100 MCG/2ML IJ SOLN
75.0000 ug | Freq: Once | INTRAMUSCULAR | Status: AC
  Administered 2019-03-05: 75 ug via INTRAVENOUS
  Filled 2019-03-05: qty 2

## 2019-03-05 MED ORDER — IOHEXOL 350 MG/ML SOLN
100.0000 mL | Freq: Once | INTRAVENOUS | Status: AC | PRN
  Administered 2019-03-05: 100 mL via INTRAVENOUS

## 2019-03-05 MED ORDER — POTASSIUM CHLORIDE CRYS ER 20 MEQ PO TBCR: 40.0000 meq | EXTENDED_RELEASE_TABLET | Freq: Once | ORAL | Status: DC

## 2019-03-05 MED ORDER — SODIUM CHLORIDE 0.9% FLUSH: 3.0000 mL | Freq: Once | INTRAVENOUS | Status: DC

## 2019-03-05 MED ORDER — POTASSIUM CHLORIDE 10 MEQ/100ML IV SOLN
10.0000 meq | INTRAVENOUS | Status: AC
  Administered 2019-03-05 (×2): 10 meq via INTRAVENOUS
  Filled 2019-03-05 (×2): qty 100

## 2019-03-05 NOTE — ED Triage Notes (Addendum)
Pt arrived via POV with reports of chest pain for several days, and numbness that she has had for 4 weeks. Pt states she takes solumedrol for the numbness.  Pt reports numbness on the left side-arm and leg as well as fingertips in the right hand.  Pt states she has a hx of MS.

## 2019-03-05 NOTE — ED Notes (Signed)
Pt off the unit at this time 

## 2019-03-05 NOTE — ED Notes (Signed)
Pt returned from xray

## 2019-03-05 NOTE — ED Notes (Signed)
Pt given lunch tray and gingerale

## 2019-03-05 NOTE — ED Notes (Signed)
Patient transported to CT 

## 2019-03-05 NOTE — ED Provider Notes (Signed)
[facility] Emergency Department Provider Note  ____________________________________________   First MD Initiated Contact with Patient 03/05/19 [phone]     (approximate)  I have reviewed the triage vital signs and the nursing notes.   HISTORY  Chief Complaint Chest Pain    HPI Jenna Rojas is a 43 y.o. female with multiple sclerosis who presents with chest pain.  Patient versus having central Chest pain that started last night before she went to bed.  She woke up this morning she continued to have the chest pain.  The chest pain is moderate, constant, radiates straight through her back, nothing makes it better or worse.  She also does endorse some left-sided tingling sensation but this has been present for 1 month.  Patient does have a history of MS and was given 3 doses of steroids which did not make the same tingling sensation better. This is abnormal that it was not better with the steroids.  Denies any abdominal pain, fevers, cough, shortness of breath.           Past Medical History:  Diagnosis Date  . Multiple sclerosis (HCC)     There are no active problems to display for this patient.   Past Surgical History:  Procedure Laterality Date  . ABDOMINAL HYSTERECTOMY      Prior to Admission medications   Not on File    Allergies Patient has no known allergies.  No family history on file.  Social History Social History   Tobacco Use  . Smoking status: Never Smoker  . Smokeless tobacco: Never Used  Substance Use Topics  . Alcohol use: Yes  . Drug use: Not on file      Review of Systems Constitutional: No fever/chills Eyes: No visual changes. ENT: No sore throat. Cardiovascular: Positive chest pain Respiratory: Denies shortness of breath. Gastrointestinal: No abdominal pain.  No nausea, no vomiting.  No diarrhea.  No constipation. Genitourinary: Negative for dysuria. Musculoskeletal: Negative for back pain. Skin: Negative  for rash. Neurological: Negative for headaches, focal weakness.  Positive numbness left side All other ROS negative ____________________________________________   PHYSICAL EXAM:  VITAL SIGNS: ED Triage Vitals  Enc Vitals Group     BP 03/05/19 0858 122/73     Pulse Rate 03/05/19 0858 65     Resp 03/05/19 0858 18     Temp 03/05/19 0858 98.4 F (36.9 C)     Temp Source 03/05/19 0858 Oral     SpO2 03/05/19 0858 99 %     Weight 03/05/19 0857 232 lb (105.2 kg)     Height 03/05/19 0857 5\' 7"  (1.702 m)     Head Circumference --      Peak Flow --      Pain Score 03/05/19 0852 5     Pain Loc --      Pain Edu? --      Excl. in GC? --     Constitutional: Alert and oriented. Well appearing and in no acute distress. Eyes: Conjunctivae are normal. EOMI. Head: Atraumatic. Nose: No congestion/rhinnorhea. Mouth/Throat: Mucous membranes are moist.   Neck: No stridor. Trachea Midline. FROM Cardiovascular: Normal rate, regular rhythm. Grossly normal heart sounds.  Good peripheral circulation.  Equal pulses Respiratory: Normal respiratory effort.  No retractions. Lungs CTAB. Gastrointestinal: Soft and nontender. No distention. No abdominal bruits.  Musculoskeletal: No lower extremity tenderness nor edema.  No joint effusions. Neurologic:  Normal speech and language. No gross focal neurologic deficits are appreciated.  Skin:  Skin is warm, dry and intact. No rash noted. Psychiatric: Mood and affect are normal. Speech and behavior are normal. GU: Deferred   ____________________________________________   LABS (all labs ordered are listed, but only abnormal results are displayed)  Labs Reviewed  BASIC METABOLIC PANEL - Abnormal; Notable for the following components:      Result Value   Potassium 2.8 (*)    BUN 21 (*)    Calcium 8.5 (*)    All other components within normal limits  CBC - Abnormal; Notable for the following components:   WBC 11.0 (*)    All other components within normal  limits  MAGNESIUM  TROPONIN I (HIGH SENSITIVITY)  TROPONIN I (HIGH SENSITIVITY)   ____________________________________________   ED ECG REPORT I, Concha SeMary E Breniyah Romm, the attending physician, personally viewed and interpreted this ECG.  EKG is normal sinus rate of 67, no ST elevation, no T wave inversion, normal intervals ____________________________________________  RADIOLOGY Vela ProseI, Toneka Fullen E Tally Mckinnon, personally viewed and evaluated these images (plain radiographs) as part of my medical decision making, as well as reviewing the written report by the radiologist.  ED MD interpretation: Chest x-ray that evidence of pneumonia  Official radiology report(s): Dg Chest 2 View  Result Date: 03/05/2019 CLINICAL DATA:  Anterior chest pain through to upper back since yesterday, lower extremity swelling. Denies hx of heart/lung disease. Non smoker EXAM: CHEST - 2 VIEW COMPARISON:  None. FINDINGS: The heart size and mediastinal contours are within normal limits. Both lungs are clear. The visualized skeletal structures are unremarkable. IMPRESSION: No active cardiopulmonary disease. Electronically Signed   By: Norva PavlovElizabeth  Brown M.D.   On: 03/05/2019 09:21    ____________________________________________   PROCEDURES  Procedure(s) performed (including Critical Care):  Procedures   ____________________________________________   INITIAL IMPRESSION / ASSESSMENT AND PLAN / ED COURSE  Jenna Rojas was evaluated in Emergency Department on 03/05/2019 for the symptoms described in the history of present illness. She was evaluated in the context of the global COVID-19 pandemic, which necessitated consideration that the patient might be at risk for infection with the SARS-CoV-2 virus that causes COVID-19. Institutional protocols and algorithms that pertain to the evaluation of patients at risk for COVID-19 are in a state of rapid change based on information released by regulatory bodies including the CDC and federal  and state organizations. These policies and algorithms were followed during the patient's care in the ED.    Patient is a 43 year old with MS who presents with central chest pain radiating to her back with left-sided numbness.  Given patient's description of pain will get CT dissection to rule out aortic dissection.  Low suspicion given how well-appearing patient is and her blood pressures are reassuring.  Also get labs to evaluate for ACS.  Will get chest x-ray to rule out pneumothorax, pneumonia although these seem less likely.  Patient denies any shortness of breath to suggest pulmonary embolism.  This could also be related to patient's MS if work-up is negative.  It sounds like the numbness is been going on for some time now but it is abnormal for it not to get better with the steroids.  Patient understands that she will need close follow-up with neurology.    Labs notable for a potassium of 2.8 will give 20 IV K.  Will add on magnesium test  White count slightly elevated at 11 but most likely secondary to recent steroids.  Initial troponin is 6.  Will get repeat troponin.  IMPRESSION:  1. No  evidence of pulmonary embolus or other acute intrathoracic  process.  2. Minimal dependent and left lower lobe subsegmental atelectasis.    Magnesium normal.  Repeat troponin normal.  Discussed with patient some potassium for the next 3 days.  Patient will follow-up with her neurologist for her sensation changes.  Her markers of her heart ruled her out for ACS.  Patient feels comfortable with this plan.   ____________________________________________   FINAL CLINICAL IMPRESSION(S) / ED DIAGNOSES   Final diagnoses:  Hypokalemia  Chest pain, unspecified type      MEDICATIONS GIVEN DURING THIS VISIT:  Medications  sodium chloride flush (NS) 0.9 % injection 3 mL (has no administration in time range)  potassium chloride 10 mEq in 100 mL IVPB (10 mEq Intravenous New Bag/Given 03/05/19 1444)   potassium chloride SA (K-DUR) CR tablet 40 mEq (0 mEq Oral Hold 03/05/19 1441)  fentaNYL (SUBLIMAZE) injection 75 mcg (75 mcg Intravenous Given 03/05/19 1023)  iohexol (OMNIPAQUE) 350 MG/ML injection 100 mL (100 mLs Intravenous Contrast Given 03/05/19 1009)     ED Discharge Orders         Ordered    potassium chloride (K-DUR) 10 MEQ tablet  Daily     03/05/19 1450           Note:  This document was prepared using Dragon voice recognition software and may include unintentional dictation errors.   Vanessa Sargent, MD 03/05/19 [phone]

## 2019-03-05 NOTE — ED Notes (Addendum)
Report received - pt is getting potassium at this time

## 2019-03-05 NOTE — Discharge Instructions (Signed)
Your work-up was reassuring including negative cardiac markers and negative CT scan.  Your potassium was low at 2.8.  You should take the potassium tablets for the next 3 days.  You should follow with your primary care doctor within 1 week for recheck.  You should follow-up with your neurologist for your numbness sensation.

## 2019-06-19 DIAGNOSIS — G35 Multiple sclerosis: Principal | ICD-10-CM

## 2020-02-25 IMAGING — CT CT ANGIO CHEST
4 of 7 series · 19 of 46 positions shown · IV contrast (APPLIED)
Comparison: Chest radiograph 03/05/2019

CLINICAL DATA: 100 ml Omnipaque 350%. chest pain for several days,
and numbness that she has had for 4 weeks. Pt states she takes
solumedrol for the numbness.

EXAM:
CT ANGIOGRAPHY CHEST WITH CONTRAST
TECHNIQUE: Multidetector CT imaging of the chest was performed using the
standard protocol during bolus administration of intravenous
contrast. Multiplanar CT image reconstructions and MIPs were
obtained to evaluate the vascular anatomy.
CONTRAST:  100mL OMNIPAQUE IOHEXOL 350 MG/ML SOLN

[Series 4: axial arterial · axial · arterial · 0.64mm/px · z∈[-617,-377]mm · 9 of 102 slices shown]
[im 11/102  lung]
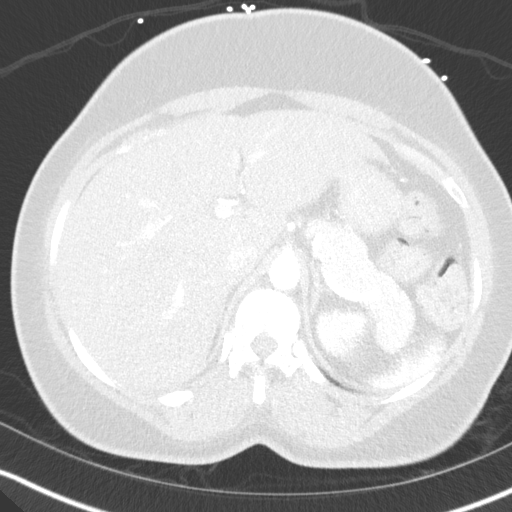
[im 21/102  soft-tissue]
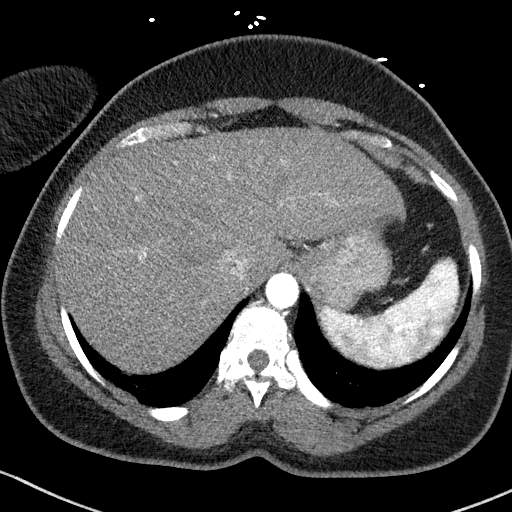
[im 31/102  lung]
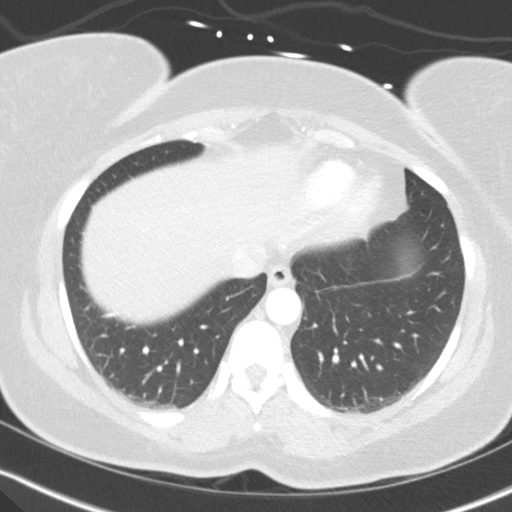
[im 41/102  soft-tissue]
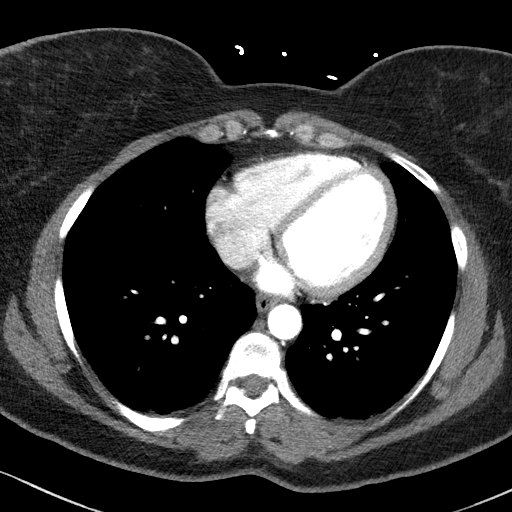
[im 51/102  lung]
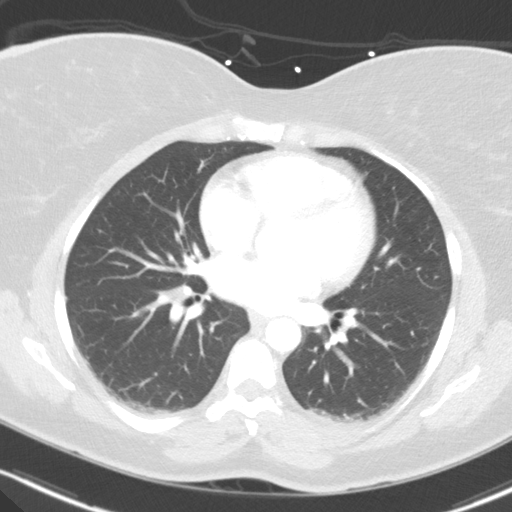
[im 61/102  soft-tissue]
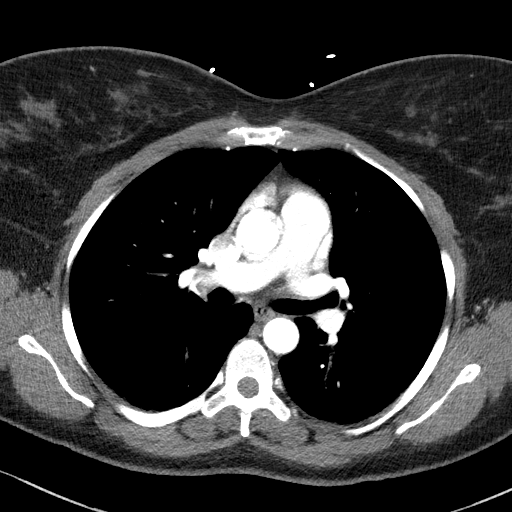
[im 71/102  lung]
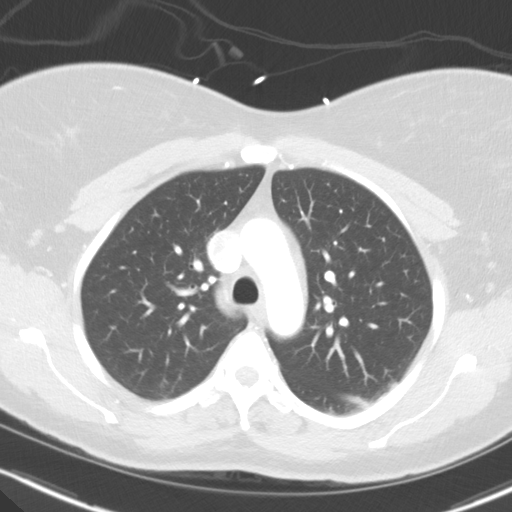
[im 81/102  soft-tissue]
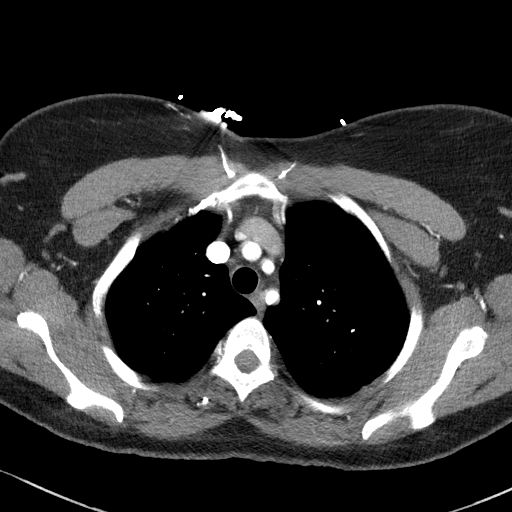
[im 91/102  lung]
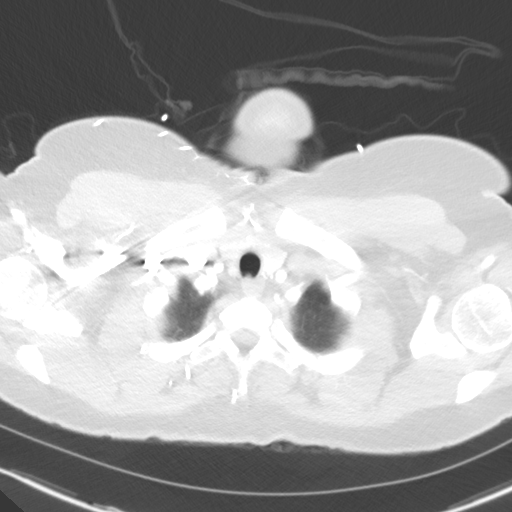

[Series 6: lung · axial · 0.64mm/px · z∈[-618,-534]mm · 3 of 148 slices shown]
[im 11/148  soft-tissue]
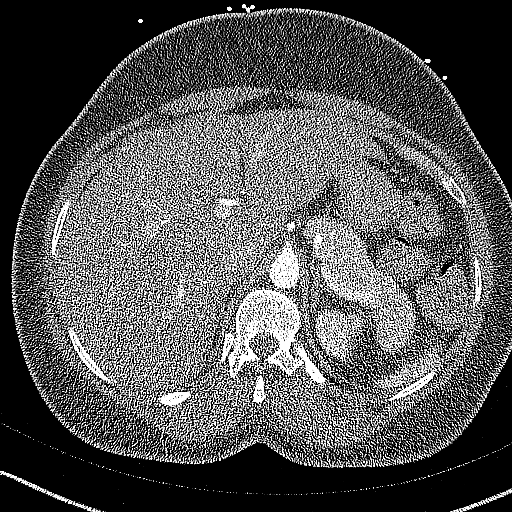
[im 32/148  soft-tissue]
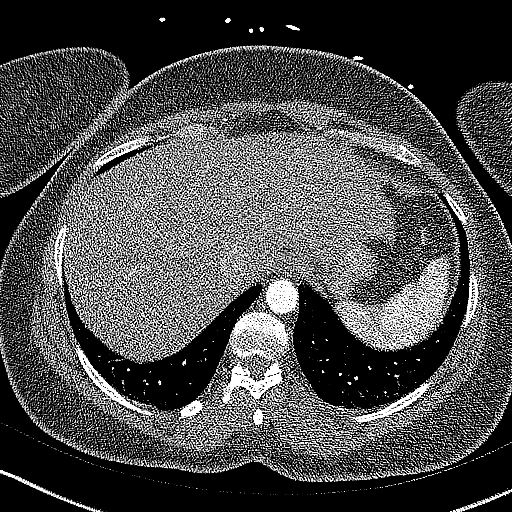
[im 53/148  soft-tissue]
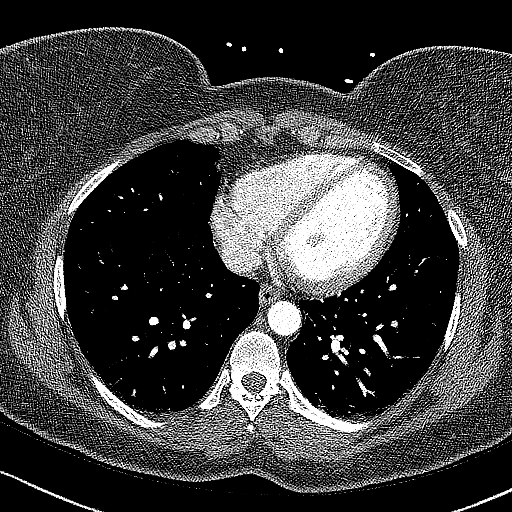

[Series 7: coronals · coronal · 0.61mm/px · 3 of 143 slices shown]
[im 36/143  soft-tissue]
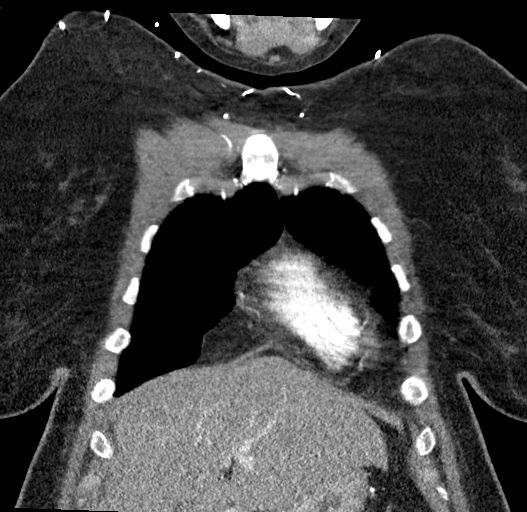
[im 72/143  soft-tissue]
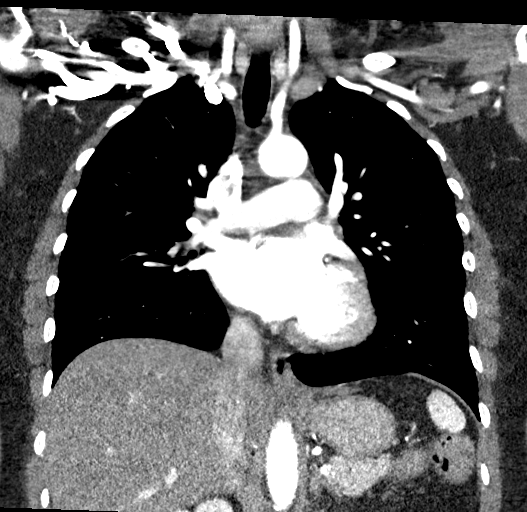
[im 107/143  soft-tissue]
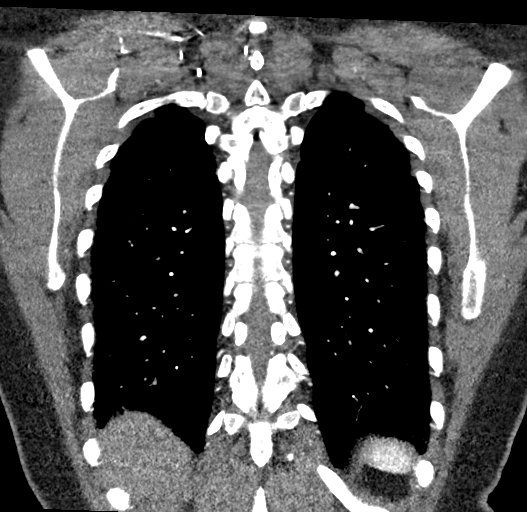

[Series 11: axial pre · axial · non-contrast · 0.64mm/px · z∈[-584,-404]mm · 4 of 61 slices shown]
[im 13/61  lung]
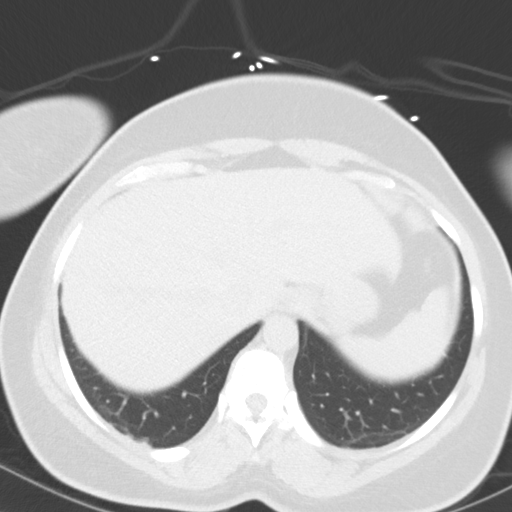
[im 25/61  lung]
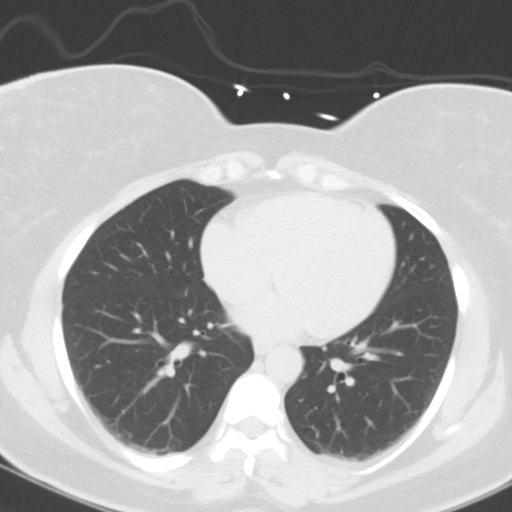
[im 37/61  lung]
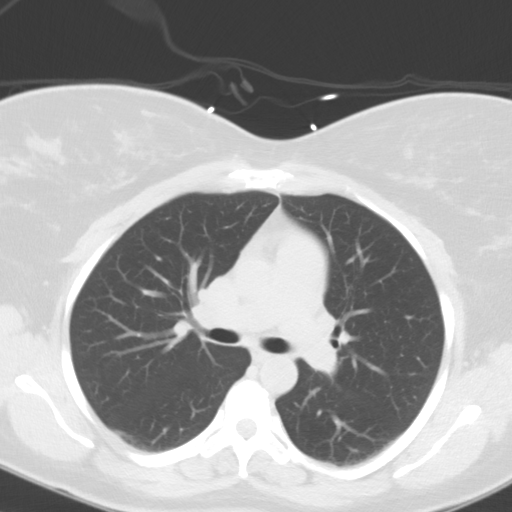
[im 49/61  lung]
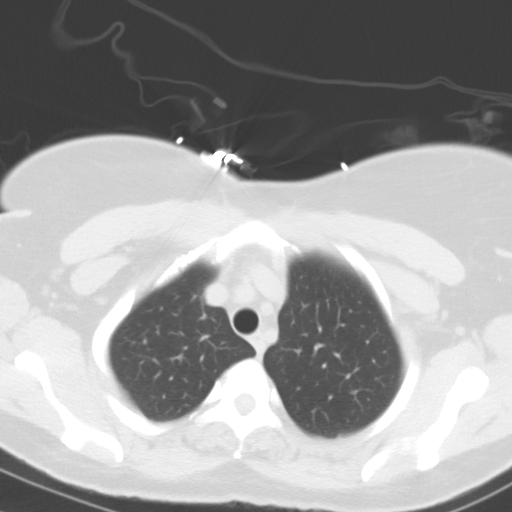

[19 of 46 positions shown; findings below may reference images not displayed]

FINDINGS: Cardiovascular: Satisfactory opacification of the pulmonary arteries
to the segmental level. No evidence of pulmonary embolism. Normal
heart size. No pericardial effusion.

Mediastinum/Nodes: No enlarged mediastinal, hilar, or axillary lymph
nodes. Thyroid gland, trachea, and esophagus demonstrate no
significant findings.

Lungs/Pleura: Minimal dependent and left lower lobe subsegmental
atelectasis. No focal infiltrate. No suspicious pulmonary nodule. No
pleural effusion or pneumothorax.

Upper Abdomen: No acute abnormality.

Musculoskeletal: No chest wall abnormality. No acute or significant
osseous findings.

Review of the MIP images confirms the above findings.
IMPRESSION: 1. No evidence of pulmonary embolus or other acute intrathoracic
process.
2. Minimal dependent and left lower lobe subsegmental atelectasis.

## 2020-06-12 ENCOUNTER — Ambulatory Visit (INDEPENDENT_AMBULATORY_CARE_PROVIDER_SITE_OTHER): Payer: 59 | Admitting: Psychology

## 2020-06-12 DIAGNOSIS — F419 Anxiety disorder, unspecified: Secondary | ICD-10-CM

## 2020-06-26 ENCOUNTER — Ambulatory Visit: Payer: 59 | Admitting: Psychology

## 2020-07-04 ENCOUNTER — Ambulatory Visit: Payer: 59 | Admitting: Psychology

## 2020-07-17 ENCOUNTER — Ambulatory Visit: Payer: 59 | Admitting: Psychology

## 2020-08-07 ENCOUNTER — Ambulatory Visit: Payer: 59 | Admitting: Psychology

## 2021-08-29 ENCOUNTER — Other Ambulatory Visit: Payer: Self-pay | Admitting: Neurology

## 2021-08-29 DIAGNOSIS — G35 Multiple sclerosis: Secondary | ICD-10-CM

## 2021-09-12 ENCOUNTER — Ambulatory Visit: Payer: Managed Care, Other (non HMO)

## 2021-11-06 ENCOUNTER — Ambulatory Visit: Payer: Managed Care, Other (non HMO) | Admitting: Dietician

## 2021-12-01 ENCOUNTER — Encounter: Payer: Self-pay | Admitting: Dietician

## 2021-12-01 NOTE — Progress Notes (Signed)
Patient cancelled her initial MNT appointment on 11/06/21 and did not wish to reschedule. Sent notification to referring provider.

## 2022-08-29 ENCOUNTER — Ambulatory Visit: Admit: 2022-08-29 | Discharge: 2022-08-30 | Payer: PRIVATE HEALTH INSURANCE

## 2022-08-29 DIAGNOSIS — J209 Acute bronchitis, unspecified: Principal | ICD-10-CM

## 2022-08-29 DIAGNOSIS — R059 Cough, unspecified type: Principal | ICD-10-CM

## 2022-08-29 MED ORDER — BENZONATATE 200 MG CAPSULE
ORAL_CAPSULE | Freq: Three times a day (TID) | ORAL | 0 refills | 7 days | Status: CP | PRN
Start: 2022-08-29 — End: 2022-09-05

## 2022-08-29 MED ORDER — PREDNISONE 10 MG TABLET
ORAL_TABLET | 0 refills | 0 days | Status: CP
Start: 2022-08-29 — End: ?

## 2023-01-23 ENCOUNTER — Ambulatory Visit: Admit: 2023-01-23 | Discharge: 2023-01-24 | Payer: PRIVATE HEALTH INSURANCE

## 2023-01-23 DIAGNOSIS — R053 Chronic cough: Principal | ICD-10-CM

## 2023-01-23 DIAGNOSIS — Z1331 Encounter for screening for depression: Principal | ICD-10-CM

## 2023-01-23 DIAGNOSIS — R0982 Postnasal drip: Principal | ICD-10-CM

## 2023-01-23 DIAGNOSIS — R052 Subacute cough: Principal | ICD-10-CM

## 2023-01-23 MED ORDER — METHYLPREDNISOLONE 4 MG TABLETS IN A DOSE PACK
0 refills | 0 days | Status: CP
Start: 2023-01-23 — End: ?

## 2023-01-23 MED ORDER — BENZONATATE 200 MG CAPSULE
ORAL_CAPSULE | Freq: Three times a day (TID) | ORAL | 0 refills | 10 days | Status: CP | PRN
Start: 2023-01-23 — End: ?

## 2023-01-23 MED ORDER — AZELASTINE 137 MCG (0.1 %) NASAL SPRAY
Freq: Two times a day (BID) | NASAL | 0 refills | 55 days | Status: CP
Start: 2023-01-23 — End: 2023-02-22

## 2023-01-23 MED ORDER — CETIRIZINE 10 MG TABLET
ORAL_TABLET | Freq: Every evening | ORAL | 0 refills | 30 days | Status: CP
Start: 2023-01-23 — End: 2023-02-22

## 2023-01-23 MED ORDER — ALBUTEROL SULFATE HFA 90 MCG/ACTUATION AEROSOL INHALER
RESPIRATORY_TRACT | 0 refills | 0 days | Status: CP | PRN
Start: 2023-01-23 — End: ?
